# Patient Record
Sex: Female | Born: 1960 | Race: White | Hispanic: No | Smoking: Current every day smoker
Health system: Southern US, Community
[De-identification: ages and names within clinical notes are randomized; demographics above are authoritative.]

## PROBLEM LIST (undated history)

## (undated) DIAGNOSIS — F32A Depression, unspecified: Secondary | ICD-10-CM

## (undated) DIAGNOSIS — F1110 Opioid abuse, uncomplicated: Secondary | ICD-10-CM

## (undated) DIAGNOSIS — J45909 Unspecified asthma, uncomplicated: Secondary | ICD-10-CM

## (undated) DIAGNOSIS — F329 Major depressive disorder, single episode, unspecified: Secondary | ICD-10-CM

## (undated) HISTORY — PX: LEG SURGERY: SHX1003

---

## 2017-01-01 ENCOUNTER — Encounter (HOSPITAL_COMMUNITY): Payer: Self-pay | Admitting: Emergency Medicine

## 2017-01-01 ENCOUNTER — Emergency Department (HOSPITAL_COMMUNITY): Payer: BC Managed Care – PPO

## 2017-01-01 ENCOUNTER — Observation Stay (HOSPITAL_COMMUNITY)
Admission: EM | Admit: 2017-01-01 | Discharge: 2017-01-02 | Disposition: A | Discharge status: Home / Self Care | Payer: BC Managed Care – PPO | Attending: Internal Medicine | Admitting: Internal Medicine

## 2017-01-01 DIAGNOSIS — F32A Depression, unspecified: Secondary | ICD-10-CM | POA: Diagnosis present

## 2017-01-01 DIAGNOSIS — R0902 Hypoxemia: Secondary | ICD-10-CM

## 2017-01-01 DIAGNOSIS — E872 Acidosis: Secondary | ICD-10-CM | POA: Diagnosis not present

## 2017-01-01 DIAGNOSIS — J9621 Acute and chronic respiratory failure with hypoxia: Principal | ICD-10-CM | POA: Insufficient documentation

## 2017-01-01 DIAGNOSIS — F329 Major depressive disorder, single episode, unspecified: Secondary | ICD-10-CM | POA: Insufficient documentation

## 2017-01-01 DIAGNOSIS — K219 Gastro-esophageal reflux disease without esophagitis: Secondary | ICD-10-CM | POA: Insufficient documentation

## 2017-01-01 DIAGNOSIS — Z79899 Other long term (current) drug therapy: Secondary | ICD-10-CM | POA: Diagnosis not present

## 2017-01-01 DIAGNOSIS — F1110 Opioid abuse, uncomplicated: Secondary | ICD-10-CM | POA: Diagnosis not present

## 2017-01-01 DIAGNOSIS — Z720 Tobacco use: Secondary | ICD-10-CM | POA: Insufficient documentation

## 2017-01-01 DIAGNOSIS — J45901 Unspecified asthma with (acute) exacerbation: Secondary | ICD-10-CM | POA: Insufficient documentation

## 2017-01-01 DIAGNOSIS — D72829 Elevated white blood cell count, unspecified: Secondary | ICD-10-CM | POA: Insufficient documentation

## 2017-01-01 DIAGNOSIS — Z888 Allergy status to other drugs, medicaments and biological substances status: Secondary | ICD-10-CM | POA: Diagnosis not present

## 2017-01-01 DIAGNOSIS — Z880 Allergy status to penicillin: Secondary | ICD-10-CM | POA: Diagnosis not present

## 2017-01-01 DIAGNOSIS — A419 Sepsis, unspecified organism: Secondary | ICD-10-CM | POA: Diagnosis present

## 2017-01-01 DIAGNOSIS — Z8249 Family history of ischemic heart disease and other diseases of the circulatory system: Secondary | ICD-10-CM | POA: Insufficient documentation

## 2017-01-01 DIAGNOSIS — Z881 Allergy status to other antibiotic agents status: Secondary | ICD-10-CM | POA: Insufficient documentation

## 2017-01-01 DIAGNOSIS — I10 Essential (primary) hypertension: Secondary | ICD-10-CM | POA: Insufficient documentation

## 2017-01-01 HISTORY — DX: Opioid abuse, uncomplicated: F11.10

## 2017-01-01 HISTORY — DX: Depression, unspecified: F32.A

## 2017-01-01 HISTORY — DX: Unspecified asthma, uncomplicated: J45.909

## 2017-01-01 HISTORY — DX: Major depressive disorder, single episode, unspecified: F32.9

## 2017-01-01 LAB — COMPREHENSIVE METABOLIC PANEL
ALBUMIN: 3.8 g/dL (ref 3.5–5.0)
ALT: 11 U/L — ABNORMAL LOW (ref 14–54)
ANION GAP: 8 (ref 5–15)
AST: 15 U/L (ref 15–41)
Alkaline Phosphatase: 55 U/L (ref 38–126)
BILIRUBIN TOTAL: 1.2 mg/dL (ref 0.3–1.2)
BUN: 13 mg/dL (ref 6–20)
CHLORIDE: 100 mmol/L — AB (ref 101–111)
CO2: 29 mmol/L (ref 22–32)
Calcium: 9.1 mg/dL (ref 8.9–10.3)
Creatinine, Ser: 0.78 mg/dL (ref 0.44–1.00)
GFR calc Af Amer: >60 mL/min (ref >60)
GFR calc non Af Amer: >60 mL/min (ref >60)
GLUCOSE: 215 mg/dL — AB (ref 65–99)
POTASSIUM: 3.6 mmol/L (ref 3.5–5.1)
SODIUM: 137 mmol/L (ref 135–145)
Total Protein: 6.6 g/dL (ref 6.5–8.1)

## 2017-01-01 LAB — RAPID URINE DRUG SCREEN, HOSP PERFORMED
AMPHETAMINES: NOT DETECTED
BARBITURATES: NOT DETECTED
BENZODIAZEPINES: POSITIVE — AB
Cocaine: NOT DETECTED
Opiates: POSITIVE — AB
TETRAHYDROCANNABINOL: NOT DETECTED

## 2017-01-01 LAB — CBC WITH DIFFERENTIAL/PLATELET
BASOS ABS: 0 10*3/uL (ref 0.0–0.1)
BASOS PCT: 0 %
EOS ABS: 0.9 10*3/uL — AB (ref 0.0–0.7)
Eosinophils Relative: 7 %
HEMATOCRIT: 39 % (ref 36.0–46.0)
Hemoglobin: 12.1 g/dL (ref 12.0–15.0)
Lymphocytes Relative: 11 %
Lymphs Abs: 1.4 10*3/uL (ref 0.7–4.0)
MCH: 26.8 pg (ref 26.0–34.0)
MCHC: 31 g/dL (ref 30.0–36.0)
MCV: 86.3 fL (ref 78.0–100.0)
Monocytes Absolute: 0.2 10*3/uL (ref 0.1–1.0)
Monocytes Relative: 1 %
NEUTROS ABS: 10.7 10*3/uL — AB (ref 1.7–7.7)
NEUTROS PCT: 81 %
Platelets: 232 10*3/uL (ref 150–400)
RBC: 4.52 MIL/uL (ref 3.87–5.11)
RDW: 15.9 % — ABNORMAL HIGH (ref 11.5–15.5)
WBC: 13.3 10*3/uL — ABNORMAL HIGH (ref 4.0–10.5)

## 2017-01-01 LAB — ETHANOL

## 2017-01-01 MED ORDER — ALBUTEROL (5 MG/ML) CONTINUOUS INHALATION SOLN
10.0000 mg/h | INHALATION_SOLUTION | RESPIRATORY_TRACT | Status: AC
  Administered 2017-01-01: 10 mg/h via RESPIRATORY_TRACT
  Filled 2017-01-01: qty 20

## 2017-01-01 MED ORDER — ALBUTEROL SULFATE (2.5 MG/3ML) 0.083% IN NEBU
5.0000 mg | INHALATION_SOLUTION | Freq: Once | RESPIRATORY_TRACT | Status: AC
  Administered 2017-01-01: 5 mg via RESPIRATORY_TRACT

## 2017-01-01 MED ORDER — ALBUTEROL SULFATE (2.5 MG/3ML) 0.083% IN NEBU
INHALATION_SOLUTION | RESPIRATORY_TRACT | Status: AC
  Filled 2017-01-01: qty 6

## 2017-01-01 NOTE — ED Provider Notes (Signed)
[facility]-EMERGENCY DEPT Provider Note   CSN: [id number] Arrival date & time: 01/01/17  1848     History   Chief Complaint Chief Complaint  Patient presents with  . Shortness of Breath  . Medical Clearance    HPI Kristin Estes is a 56 y.o. female.    Patient is a 56 year old female with past medical history significant for asthma, tobacco use, heroin use, who presents to the emergency department with shortness of breath and hypoxia. Patient was going to an inpatient rehabilitation facility to check in for heroin use. When they arrived to this facility patient was having a difficulty time breathing and was very somnolent. O2 sats were 88% with EMS. Patient states that she had a one-day history of cough, congestion, increased sputum production. Endorses some shortness of breath that progressively worse throughout the day. Denies chest pain. Denies fever or chills. States that she snorted cocaine prior to getting to the rehabilitation facility. Denies any other drug use. Denies prior history of DVT/PE.   Past Medical History:  Diagnosis Date  . Asthma   . Depression   . Heroin abuse     Patient Active Problem List   Diagnosis Date Noted  . Asthma exacerbation 01/01/2017  . Acute on chronic respiratory failure with hypoxia (HCC) 01/01/2017  . GERD (gastroesophageal reflux disease) 01/01/2017  . Heroin abuse   . Depression     History reviewed. No pertinent surgical history.  OB History    No data available       Home Medications    Prior to Admission medications   Medication Sig Start Date End Date Taking? Authorizing Provider  albuterol (PROVENTIL) (2.5 MG/3ML) 0.083% nebulizer solution Take 2.5 mg by nebulization every 6 (six) hours as needed for wheezing.  11/10/16  Yes [provider]  ALPRAZolam Prudy Feeler) 1 MG tablet Take 1-2 tablets by mouth 3 (three) times daily as needed for anxiety.  12/22/16  Yes [provider]  amphetamine-dextroamphetamine  (ADDERALL) 30 MG tablet Take 30 mg by mouth 2 (two) times daily. 12/03/16  Yes [provider]  Brexpiprazole (REXULTI) 3 MG TABS Take 3 mg by mouth daily.   Yes [provider]  DULoxetine (CYMBALTA) 60 MG capsule Take 60 mg by mouth daily. 12/19/16  Yes [provider]  lisinopril (PRINIVIL,ZESTRIL) 20 MG tablet Take 20 mg by mouth daily. 10/14/16  Yes [provider]  PROAIR HFA 108 (90 Base) MCG/ACT inhaler Inhale 1-2 puffs into the lungs every 6 (six) hours as needed for wheezing.  11/16/16  Yes [provider]    Family History No family history on file.  Social History Social History  Substance Use Topics  . Smoking status: Not on file  . Smokeless tobacco: Not on file  . Alcohol use No     Allergies   Ceclor [cefaclor]; Ciprofloxacin; and Penicillins   Review of Systems Review of Systems  Constitutional: Positive for fatigue. Negative for appetite change and fever.  HENT: Positive for congestion.   Respiratory: Positive for cough and shortness of breath. Negative for chest tightness.   Cardiovascular: Negative for chest pain and leg swelling.  Gastrointestinal: Negative for abdominal pain, diarrhea and vomiting.  Genitourinary: Negative for dysuria.  Musculoskeletal: Negative for back pain.  Skin: Negative for rash.  Neurological: Negative for dizziness, seizures, weakness and light-headedness.  Psychiatric/Behavioral: Positive for agitation.     Physical Exam Updated Vital Signs BP (!) 136/99   Pulse (!) 123   Temp 98 F (36.7  C) (Oral)   Resp (!) 25   Ht 5\' 3"  (1.6 m)   Wt 78.9 kg (174 lb)   SpO2 97%   BMI 30.82 kg/m   Physical Exam  Constitutional: She is oriented to person, place, and time. She appears well-developed and well-nourished.  HENT:  Head: Atraumatic.  Mouth/Throat: Oropharynx is clear and moist.  Eyes: Conjunctivae and EOM are normal. Pupils are equal, round, and reactive to light.  Neck: Normal  range of motion. Neck supple. No JVD present.  Cardiovascular: Regular rhythm, normal heart sounds and intact distal pulses.   Tachycardia  Pulmonary/Chest: She is in respiratory distress. She has wheezes.  Mild respiratory distress with tachypnea. Diffuse inspiratory and expiratory wheezing throughout. No focal crackles or rales. Good air movement throughout. 2 L nasal cannula.  Abdominal: She exhibits no distension and no mass. There is no tenderness. There is no guarding.  Musculoskeletal: Normal range of motion. She exhibits no edema.  No unilateral leg swelling.  Neurological: She is alert and oriented to person, place, and time.  Skin: Skin is warm. Capillary refill takes less than 2 seconds. No pallor.  Psychiatric:  Anxious appearing     ED Treatments / Results  Labs (all labs ordered are listed, but only abnormal results are displayed) Labs Reviewed  CBC WITH DIFFERENTIAL/PLATELET - Abnormal; Notable for the following:       Result Value   WBC 13.3 (*)    RDW 15.9 (*)    Neutro Abs 10.7 (*)    Eosinophils Absolute 0.9 (*)    All other components within normal limits  COMPREHENSIVE METABOLIC PANEL - Abnormal; Notable for the following:    Chloride 100 (*)    Glucose, Bld 215 (*)    ALT 11 (*)    All other components within normal limits  RAPID URINE DRUG SCREEN, HOSP PERFORMED - Abnormal; Notable for the following:    Opiates POSITIVE (*)    Benzodiazepines POSITIVE (*)    All other components within normal limits  ETHANOL  HCG, QUANTITATIVE, PREGNANCY  BLOOD GAS, ARTERIAL    EKG  EKG Interpretation  Date/Time:  Thursday January 01 2017 18:52:34 EDT Ventricular Rate:  91 PR Interval:  138 QRS Duration: 78 QT Interval:  380 QTC Calculation: 467 R Axis:   51 Text Interpretation:  Normal sinus rhythm Normal ECG No old tracing to compare Confirmed by Dione Booze ([id number]) on 01/01/2017 10:52:13 PM       Radiology Dg Chest 2 View  Result Date:  01/01/2017 CLINICAL DATA:  Wheezing and coughing.  Recent use of heroin. EXAM: CHEST  2 VIEW COMPARISON:  None. FINDINGS: The lungs are clear. The pulmonary vasculature is normal. Heart size is normal. Hilar and mediastinal contours are unremarkable. There is no pleural effusion. IMPRESSION: No active cardiopulmonary disease. Electronically Signed   By: Ellery Plunk M.D.   On: 01/01/2017 21:51    Procedures Procedures (including critical care time)  Medications Ordered in ED Medications  albuterol (PROVENTIL,VENTOLIN) solution continuous neb (0 mg/hr Nebulization Stopped 01/01/17 2229)  albuterol (PROVENTIL) (2.5 MG/3ML) 0.083% nebulizer solution 5 mg (5 mg Nebulization Given 01/01/17 1901)     Initial Impression / Assessment and Plan / ED Course  I have reviewed the triage vital signs and the nursing notes.  Pertinent labs & imaging results that were available during my care of the patient were reviewed by me and considered in my medical decision making (see chart for details).     Patient  is a 56 year old with past medical history significant for asthma and heroin abuse, who presents to the emergency department with a one-day history of shortness of breath. SpO2 88% with EMS. Given IV Solu-Medrol and DuoNeb's prior to arrival. Patient endorsed snorting heroin approximately 3 PM. On arrival patient with mild respiratory distress. Able to speak in full sentences, no respiratory depression. Lungs with diffuse inspiratory and expiratory wheezing. DVT.  Lab work remarkable for leukocytosis of 13.3. Hemoglobin stable. No significant electrolyte abnormalities. UDS positive for opioids and benzos (takes Xanax). Chest x-ray showed no signs of pneumonia or pneumothorax. Patient most likely with an asthma exacerbation secondary to upper respiratory infection. Patient given continuous albuterol nebulizer.  On reevaluation patient with improvement of her respiratory distress, speaking in full  sentences. Continues to have diffuse wheezing. Attempted to wean off of O2, hypoxic at 87% on room air. Placed on 2 L nasal cannula. Pt is not somnolent or with respiratory depression, narcan not necessary at this time.  Doubt PE, low risk well's criteria, more likely asthma in the setting of wheezing and URI.    Patient admitted to hospitalist, Dr. Clyde LundborgNiu, for further management of acute hypoxic respiratory failure.  Final Clinical Impressions(s) / ED Diagnoses   Final diagnoses:  Heroin abuse  Hypoxia  Moderate asthma with exacerbation, unspecified whether persistent    New Prescriptions New Prescriptions   No medications on file     Corena HerterMumma, Kendle Turbin, MD 01/02/17 [id number]    Benjiman CorePickering, Nathan, MD 01/04/17 [phone]

## 2017-01-01 NOTE — ED Notes (Signed)
[facility] that [facility]. can return to their facility when pt. Is discharged home .

## 2017-01-01 NOTE — ED Triage Notes (Signed)
Per ems, pt was getting checked out at [facility] for [facility] use, pt used last at 3pm, o2 sats 89% RA for ems, pt had wheezing and cough throughout, pt takes albuterol at home, gave her one neb, 125 solumedrol. Pt is AAOX4.

## 2017-01-01 NOTE — ED Triage Notes (Signed)
Pt states she just wants to be cleared to go back to [facility].

## 2017-01-01 NOTE — H&P (Signed)
History and Physical    [name] ZOX:[id number] DOB: [birth year] DOA: 01/01/2017  Referring MD/NP/PA:   PCP: System, Pcp Not In   Patient coming from:  The patient is coming from home.  At baseline, pt is independent for most of ADL.   Chief Complaint: Cough, shortness breath, wheezing  HPI: Kristin Estes is a 56 y.o. female with medical history significant of hypertension, asthma, GERD, depression, hearing abuse, tobacco abuse, who presents with cough, shortness breath and wheezing.  Patient states that she has been having cough, shortness breath, and wheezing for 3 days, which has been progressively getting worse. She coughs up yellow colored sputum. Denies fever, chills or chest pain. She can speak in full sentence. Denies nausea, vomiting, diarrhea, abdominal pain, symptoms of UTI or unilateral weakness. Patient states that she went to [facility] yesterday and physical examination. Ptient was found to have wheezing and IN desaturation, therefore she was advised to come to the hospital for further eval and treatment.  ED Course: pt was found to have WBC 13.3, lactic acid >7.0, positive UDS for opiate and benzo, electrolytes renal function okay, tachycardia, tachypnea, oxygen saturation 89% on room air, temperature normal, negative chest x-ray for infiltration, alcohol level less than 5. Patient is placed on telemetry bed for observation.  Review of Systems:   General: no fevers, chills, no changes in body weight, has poor appetite, has fatigue.  HEENT: no blurry vision, hearing changes or sore throat Respiratory: has dyspnea, coughing, wheezing CV: no chest pain, no palpitations GI: no nausea, vomiting, abdominal pain, diarrhea, constipation GU: no dysuria, burning on urination, increased urinary frequency, hematuria  Ext: no leg edema Neuro: no unilateral weakness, numbness, or tingling, no vision change or hearing loss Skin: no rash, no skin tear. MSK: No muscle spasm, no  deformity, no limitation of range of movement in spin Heme: No easy bruising.  Travel history: No recent long distant travel.  Allergy:  Allergies  Allergen Reactions  . Ceclor [Cefaclor] Anaphylaxis  . Ciprofloxacin Anaphylaxis  . Penicillins Anaphylaxis    Past Medical History:  Diagnosis Date  . Asthma   . Depression   . Heroin abuse     Past Surgical History:  Procedure Laterality Date  . LEG SURGERY Left    Hematoma    Social History:  reports that she has been smoking.  She does not have any smokeless tobacco history on file. She reports that she uses drugs. She reports that she does not drink alcohol.  Family History:  Family History  Problem Relation Age of Onset  . Hypertension Mother      Prior to Admission medications   Medication Sig Start Date End Date Taking? Authorizing Provider  albuterol (PROVENTIL) (2.5 MG/3ML) 0.083% nebulizer solution Take 2.5 mg by nebulization every 6 (six) hours as needed for wheezing.  11/10/16  Yes [provider]  ALPRAZolam Prudy Feeler) 1 MG tablet Take 1-2 tablets by mouth 3 (three) times daily as needed for anxiety.  12/22/16  Yes [provider]  amphetamine-dextroamphetamine (ADDERALL) 30 MG tablet Take 30 mg by mouth 2 (two) times daily. 12/03/16  Yes [provider]  Brexpiprazole (REXULTI) 3 MG TABS Take 3 mg by mouth daily.   Yes [provider]  DULoxetine (CYMBALTA) 60 MG capsule Take 60 mg by mouth daily. 12/19/16  Yes [provider]  lisinopril (PRINIVIL,ZESTRIL) 20 MG tablet Take 20 mg by mouth daily. 10/14/16  Yes [provider]  PROAIR HFA 108 [phone] Base)  MCG/ACT inhaler Inhale 1-2 puffs into the lungs every 6 (six) hours as needed for wheezing.  11/16/16  Yes [provider]    Physical Exam: Vitals:   01/02/17 0130 01/02/17 0304 01/02/17 0424 01/02/17 0501  BP: (!) 141/86 (!) 147/75  (!) 145/67  Pulse: (!) 113 (!) 110 (!) 119 (!) 116  Resp: 14 18 20 19     Temp:  98 F (36.7 C)  98.1 F (36.7 C)  TempSrc:  Oral  Oral  SpO2: 99% 99% 95% 100%  Weight:      Height:       General: Not in acute distress. Patient is tremulous and anxious. HEENT:       Eyes: PERRL, EOMI, no scleral icterus.       ENT: No discharge from the ears and nose, no pharynx injection, no tonsillar enlargement.        Neck: No JVD, no bruit, no mass felt. Heme: No neck lymph node enlargement. Cardiac: S1/S2, RRR, No murmurs, No gallops or rubs. Respiratory: Patient has wheezing bilaterally.  GI: Soft, nondistended, nontender, no rebound pain, no organomegaly, BS present. GU: No hematuria Ext: No pitting leg edema bilaterally. 2+DP/PT pulse bilaterally. Musculoskeletal: No joint deformities, No joint redness or warmth, no limitation of ROM in spin. Skin: No rashes.  Neuro: Alert, oriented X3, cranial nerves II-XII grossly intact, moves all extremities normally. Psych: Patient is not psychotic, no suicidal or hemocidal ideation.  Labs on Admission: I have personally reviewed following labs and imaging studies  CBC:  Recent Labs Lab 01/01/17 2110  WBC 13.3*  NEUTROABS 10.7*  HGB 12.1  HCT 39.0  MCV 86.3  PLT 232   Basic Metabolic Panel:  Recent Labs Lab 01/01/17 2110  NA 137  K 3.6  CL 100*  CO2 29  GLUCOSE 215*  BUN 13  CREATININE 0.78  CALCIUM 9.1   GFR: Estimated Creatinine Clearance: 80 mL/min (by C-G formula based on SCr of 0.78 mg/dL). Liver Function Tests:  Recent Labs Lab 01/01/17 2110  AST 15  ALT 11*  ALKPHOS 55  BILITOT 1.2  PROT 6.6  ALBUMIN 3.8   No results for input(s): LIPASE, AMYLASE in the last 168 hours. No results for input(s): AMMONIA in the last 168 hours. Coagulation Profile: No results for input(s): INR, PROTIME in the last 168 hours. Cardiac Enzymes: No results for input(s): CKTOTAL, CKMB, CKMBINDEX, TROPONINI in the last 168 hours. BNP (last 3 results) No results for input(s): PROBNP in the last 8760  hours. HbA1C: No results for input(s): HGBA1C in the last 72 hours. CBG: No results for input(s): GLUCAP in the last 168 hours. Lipid Profile: No results for input(s): CHOL, HDL, LDLCALC, TRIG, CHOLHDL, LDLDIRECT in the last 72 hours. Thyroid Function Tests: No results for input(s): TSH, T4TOTAL, FREET4, T3FREE, THYROIDAB in the last 72 hours. Anemia Panel: No results for input(s): VITAMINB12, FOLATE, FERRITIN, TIBC, IRON, RETICCTPCT in the last 72 hours. Urine analysis: No results found for: COLORURINE, APPEARANCEUR, LABSPEC, PHURINE, GLUCOSEU, HGBUR, BILIRUBINUR, KETONESUR, PROTEINUR, UROBILINOGEN, NITRITE, LEUKOCYTESUR Sepsis Labs: @LABRCNTIP (procalcitonin:4,lacticidven:4) )No results found for this or any previous visit (from the past 240 hour(s)).   Radiological Exams on Admission: Dg Chest 2 View  Result Date: 01/01/2017 CLINICAL DATA:  Wheezing and coughing.  Recent use of heroin. EXAM: CHEST  2 VIEW COMPARISON:  None. FINDINGS: The lungs are clear. The pulmonary vasculature is normal. Heart size is normal. Hilar and mediastinal contours are unremarkable. There is no pleural effusion. IMPRESSION: No  active cardiopulmonary disease. Electronically Signed   By: Ellery Plunk M.D.   On: 01/01/2017 21:51     EKG: Independently reviewed.  Sinus rhythm, tachycardia, no ischemic change.  Assessment/Plan Principal Problem:   Acute on chronic respiratory failure with hypoxia (HCC) Active Problems:   Heroin abuse   Depression   Asthma exacerbation   Tobacco abuse   Sepsis (HCC)  Acute on chronic respiratory failure with hypoxia due to asthma exacerbation and sepsis: Patient has bilateral wheezing on AUSCULTATION, productive cough, and shortness of breath, consistent with asthma exacerbation. No infiltration on chest x-ray. Patient does not have chest pain, less likely to have PE. Patient meets criteria for sepsis with leukocytosis, and tachypnea, elevated lactate>7.0. Her  significantly elevated lactic acid is likely partially from tremulous muscle movement. Patient is hemodynamically stable.  -will place on tele bed for obs -Nebulizers: Scheduled Atrovent and prn Xopenex nebs -Z-pak -Solu-Medrol 60 mg IV q8h  -Mucinex for cough  -Urine S. pneumococcal antigen -Follow up blood culture x2, sputum culture, respiratory virus panel -will get Procalcitonin and trend lactic acid levels per sepsis protocol. -IVF: 3.5L of NS bolus in ED, followed by 125 cc/h   Tobacco abuse: -Did counseling about importance of quitting smoking -Nicotine patch  Depression:  no suicidal or homicidal ideations. -Continue home medications: RExulti, Cymbalta  HTN: -Lisinopril  Heroin abuse: -When necessary Ativan, clonidine, naproxen, buprenorphine -pt wants to go back to [facility]-->consult to SW   DVT ppx: SQ Lovenox Code Status: Full code Family Communication: None at bed side.   Disposition Plan:  Anticipate discharge back to previous home environment Consults called:  none Admission status: Obs / tele   Date of Service 01/02/2017    Lorretta Harp [facility] Pager [phone]  If 7PM-7AM, please contact night-coverage [email or web address] Password [facility] 01/02/2017, 6:42 AM

## 2017-01-01 NOTE — ED Notes (Signed)
Patient transported to X-ray 

## 2017-01-01 NOTE — ED Notes (Signed)
Nurse updated family on pt.'s admission plan .

## 2017-01-01 NOTE — ED Notes (Signed)
Kristin RilyPat Estes ( Sister)  : Can be called for updates regarding pt. Condition . Tel : [phone]

## 2017-01-02 ENCOUNTER — Encounter (HOSPITAL_COMMUNITY): Payer: Self-pay

## 2017-01-02 DIAGNOSIS — R0902 Hypoxemia: Secondary | ICD-10-CM

## 2017-01-02 DIAGNOSIS — Z720 Tobacco use: Secondary | ICD-10-CM

## 2017-01-02 DIAGNOSIS — J9621 Acute and chronic respiratory failure with hypoxia: Secondary | ICD-10-CM | POA: Diagnosis not present

## 2017-01-02 DIAGNOSIS — A419 Sepsis, unspecified organism: Secondary | ICD-10-CM | POA: Diagnosis present

## 2017-01-02 DIAGNOSIS — F329 Major depressive disorder, single episode, unspecified: Secondary | ICD-10-CM | POA: Diagnosis not present

## 2017-01-02 DIAGNOSIS — J45901 Unspecified asthma with (acute) exacerbation: Secondary | ICD-10-CM | POA: Diagnosis not present

## 2017-01-02 LAB — CBC
HEMATOCRIT: 37 % (ref 36.0–46.0)
HEMOGLOBIN: 11.9 g/dL — AB (ref 12.0–15.0)
MCH: 27.5 pg (ref 26.0–34.0)
MCHC: 32.2 g/dL (ref 30.0–36.0)
MCV: 85.5 fL (ref 78.0–100.0)
Platelets: 210 10*3/uL (ref 150–400)
RBC: 4.33 MIL/uL (ref 3.87–5.11)
RDW: 16.1 % — ABNORMAL HIGH (ref 11.5–15.5)
WBC: 12.3 10*3/uL — AB (ref 4.0–10.5)

## 2017-01-02 LAB — RESPIRATORY PANEL BY PCR
Adenovirus: NOT DETECTED
BORDETELLA PERTUSSIS-RVPCR: NOT DETECTED
CORONAVIRUS 229E-RVPPCR: NOT DETECTED
CORONAVIRUS HKU1-RVPPCR: NOT DETECTED
Chlamydophila pneumoniae: NOT DETECTED
Coronavirus NL63: NOT DETECTED
Coronavirus OC43: NOT DETECTED
Influenza A: NOT DETECTED
Influenza B: NOT DETECTED
METAPNEUMOVIRUS-RVPPCR: NOT DETECTED
MYCOPLASMA PNEUMONIAE-RVPPCR: NOT DETECTED
PARAINFLUENZA VIRUS 2-RVPPCR: NOT DETECTED
Parainfluenza Virus 1: NOT DETECTED
Parainfluenza Virus 3: NOT DETECTED
Parainfluenza Virus 4: NOT DETECTED
Respiratory Syncytial Virus: NOT DETECTED
Rhinovirus / Enterovirus: NOT DETECTED

## 2017-01-02 LAB — HIV ANTIBODY (ROUTINE TESTING W REFLEX): HIV SCREEN 4TH GENERATION: NONREACTIVE

## 2017-01-02 LAB — HCG, QUANTITATIVE, PREGNANCY: hCG, Beta Chain, Quant, S: <1 m[IU]/mL (ref <5)

## 2017-01-02 LAB — LACTIC ACID, PLASMA
LACTIC ACID, VENOUS: 2.4 mmol/L — AB (ref 0.5–1.9)
Lactic Acid, Venous: 7 mmol/L (ref 0.5–1.9)

## 2017-01-02 LAB — STREP PNEUMONIAE URINARY ANTIGEN: STREP PNEUMO URINARY ANTIGEN: NEGATIVE

## 2017-01-02 LAB — PROCALCITONIN

## 2017-01-02 MED ORDER — AZITHROMYCIN 500 MG PO TABS: 250.0000 mg | ORAL_TABLET | Freq: Every day | ORAL | Status: DC

## 2017-01-02 MED ORDER — LEVALBUTEROL HCL 1.25 MG/0.5ML IN NEBU
1.2500 mg | INHALATION_SOLUTION | Freq: Three times a day (TID) | RESPIRATORY_TRACT | Status: DC
Start: 2017-01-02 — End: 2017-01-02
  Administered 2017-01-02 (×2): 1.25 mg via RESPIRATORY_TRACT
  Filled 2017-01-02 (×2): qty 0.5

## 2017-01-02 MED ORDER — LEVALBUTEROL HCL 0.63 MG/3ML IN NEBU: 0.6300 mg | INHALATION_SOLUTION | Freq: Three times a day (TID) | RESPIRATORY_TRACT | Status: DC | PRN

## 2017-01-02 MED ORDER — ONDANSETRON HCL 4 MG/2ML IJ SOLN: 4.0000 mg | Freq: Three times a day (TID) | INTRAMUSCULAR | Status: DC | PRN

## 2017-01-02 MED ORDER — NICOTINE 21 MG/24HR TD PT24: 21.0000 mg | MEDICATED_PATCH | Freq: Every day | TRANSDERMAL | 0 refills | Status: AC

## 2017-01-02 MED ORDER — AZITHROMYCIN 250 MG PO TABS: 250.0000 mg | ORAL_TABLET | Freq: Every day | ORAL | 0 refills | Status: AC

## 2017-01-02 MED ORDER — BUPRENORPHINE HCL 2 MG SL SUBL
4.0000 mg | SUBLINGUAL_TABLET | Freq: Every day | SUBLINGUAL | Status: DC
  Administered 2017-01-02: 4 mg via SUBLINGUAL
  Filled 2017-01-02: qty 2

## 2017-01-02 MED ORDER — ENOXAPARIN SODIUM 40 MG/0.4ML ~~LOC~~ SOLN
40.0000 mg | SUBCUTANEOUS | Status: DC
  Filled 2017-01-02: qty 0.4

## 2017-01-02 MED ORDER — PREDNISONE 10 MG PO TABS: 10.0000 mg | ORAL_TABLET | Freq: Every day | ORAL | 0 refills | Status: AC

## 2017-01-02 MED ORDER — SODIUM CHLORIDE 0.9 % IV BOLUS (SEPSIS)
1000.0000 mL | Freq: Once | INTRAVENOUS | Status: AC
  Administered 2017-01-02: 1000 mL via INTRAVENOUS

## 2017-01-02 MED ORDER — CLONIDINE HCL 0.1 MG PO TABS: 0.1000 mg | ORAL_TABLET | Freq: Three times a day (TID) | ORAL | Status: DC

## 2017-01-02 MED ORDER — AMPHETAMINE-DEXTROAMPHETAMINE 10 MG PO TABS
30.0000 mg | ORAL_TABLET | Freq: Two times a day (BID) | ORAL | Status: DC
  Filled 2017-01-02: qty 3

## 2017-01-02 MED ORDER — IPRATROPIUM BROMIDE 0.02 % IN SOLN
0.5000 mg | Freq: Three times a day (TID) | RESPIRATORY_TRACT | Status: DC
  Administered 2017-01-02 (×2): 0.5 mg via RESPIRATORY_TRACT
  Filled 2017-01-02 (×2): qty 2.5

## 2017-01-02 MED ORDER — AZITHROMYCIN 500 MG PO TABS
500.0000 mg | ORAL_TABLET | Freq: Every day | ORAL | Status: AC
  Administered 2017-01-02: 500 mg via ORAL
  Filled 2017-01-02: qty 1

## 2017-01-02 MED ORDER — ACETAMINOPHEN 325 MG PO TABS: 650.0000 mg | ORAL_TABLET | Freq: Four times a day (QID) | ORAL | Status: DC | PRN

## 2017-01-02 MED ORDER — ALPRAZOLAM 0.25 MG PO TABS: 1.0000 mg | ORAL_TABLET | Freq: Three times a day (TID) | ORAL | Status: DC | PRN

## 2017-01-02 MED ORDER — LEVALBUTEROL HCL 1.25 MG/0.5ML IN NEBU
1.2500 mg | INHALATION_SOLUTION | Freq: Four times a day (QID) | RESPIRATORY_TRACT | Status: DC
  Administered 2017-01-02: 1.25 mg via RESPIRATORY_TRACT
  Filled 2017-01-02: qty 0.5

## 2017-01-02 MED ORDER — ZOLPIDEM TARTRATE 5 MG PO TABS: 5.0000 mg | ORAL_TABLET | Freq: Every evening | ORAL | Status: DC | PRN

## 2017-01-02 MED ORDER — IPRATROPIUM BROMIDE 0.02 % IN SOLN
0.5000 mg | RESPIRATORY_TRACT | Status: DC
  Administered 2017-01-02 (×2): 0.5 mg via RESPIRATORY_TRACT
  Filled 2017-01-02 (×2): qty 2.5

## 2017-01-02 MED ORDER — NAPROXEN 250 MG PO TABS
375.0000 mg | ORAL_TABLET | Freq: Two times a day (BID) | ORAL | Status: DC
Start: 2017-01-02 — End: 2017-01-02
  Administered 2017-01-02 (×2): 375 mg via ORAL
  Filled 2017-01-02 (×2): qty 2

## 2017-01-02 MED ORDER — METHYLPREDNISOLONE SODIUM SUCC 125 MG IJ SOLR
60.0000 mg | Freq: Three times a day (TID) | INTRAMUSCULAR | Status: DC
  Administered 2017-01-02 (×2): 60 mg via INTRAVENOUS
  Filled 2017-01-02 (×2): qty 2

## 2017-01-02 MED ORDER — SODIUM CHLORIDE 0.9 % IV SOLN
INTRAVENOUS | Status: DC
  Administered 2017-01-02: 01:00:00 via INTRAVENOUS

## 2017-01-02 MED ORDER — LISINOPRIL 20 MG PO TABS
20.0000 mg | ORAL_TABLET | Freq: Every day | ORAL | Status: DC
Start: 2017-01-02 — End: 2017-01-02
  Administered 2017-01-02: 20 mg via ORAL
  Filled 2017-01-02: qty 1

## 2017-01-02 MED ORDER — HYDRALAZINE HCL 20 MG/ML IJ SOLN: 5.0000 mg | INTRAMUSCULAR | Status: DC | PRN

## 2017-01-02 MED ORDER — NICOTINE 21 MG/24HR TD PT24: 21.0000 mg | MEDICATED_PATCH | Freq: Every day | TRANSDERMAL | Status: DC

## 2017-01-02 MED ORDER — BREXPIPRAZOLE 1 MG PO TABS
3.0000 mg | ORAL_TABLET | Freq: Every day | ORAL | Status: DC
  Administered 2017-01-02: 3 mg via ORAL
  Filled 2017-01-02: qty 3

## 2017-01-02 MED ORDER — CLONIDINE HCL 0.1 MG PO TABS
0.1000 mg | ORAL_TABLET | Freq: Three times a day (TID) | ORAL | Status: DC
  Administered 2017-01-02 (×3): 0.1 mg via ORAL
  Filled 2017-01-02 (×3): qty 1

## 2017-01-02 MED ORDER — SODIUM CHLORIDE 0.9 % IV BOLUS (SEPSIS)
2500.0000 mL | Freq: Once | INTRAVENOUS | Status: AC
  Administered 2017-01-02: 2500 mL via INTRAVENOUS

## 2017-01-02 MED ORDER — ALPRAZOLAM 0.5 MG PO TABS
1.0000 mg | ORAL_TABLET | Freq: Three times a day (TID) | ORAL | Status: DC | PRN
  Administered 2017-01-02 (×2): 1 mg via ORAL
  Filled 2017-01-02 (×2): qty 2

## 2017-01-02 MED ORDER — DULOXETINE HCL 60 MG PO CPEP
60.0000 mg | ORAL_CAPSULE | Freq: Every day | ORAL | Status: DC
  Administered 2017-01-02: 60 mg via ORAL
  Filled 2017-01-02: qty 1

## 2017-01-02 NOTE — Progress Notes (Signed)
Pt still tremoring paged MD again. No response from first page. Gave pt Xanax.

## 2017-01-02 NOTE — Progress Notes (Signed)
Patient discharge teaching given, including activity, diet, follow-up appoints, and medications. Patient verbalized understanding of all discharge instructions. IV access was d/c'd. Vitals are stable. Skin is intact except as charted in most recent assessments. Pt to be escorted out by NT, to be driven to rehab facility.

## 2017-01-02 NOTE — Discharge Summary (Signed)
Physician Discharge Summary  Kristin Estes ZOX:[id number] DOB: Sep 12, 1960 DOA: 01/01/2017  PCP: System, Pcp Not In  Admit date: 01/01/2017 Discharge date: 01/02/2017  Time spent: 45 minutes  Recommendations for Outpatient Follow-up:  Patient will be discharged to [facility].  Patient will need to follow up with primary care provider within one week of discharge, repeat CBC.  Patient should continue medications as prescribed.  Patient should follow a regular diet.   Discharge Diagnoses:  Acute on chronic hypoxic respiratory failure/asthma exacerbation Lactic acidosis Leukocytosis Depression Essential hypertension Tobacco abuse Heroin Abuse  Discharge Condition: Stable  Diet recommendation: regular  Filed Weights   01/01/17 1856  Weight: 78.9 kg (174 lb)    History of present illness:  On 01/01/2017 by Dr. Lorretta Harp Linnette Panella is a 56 y.o. female with medical history significant of hypertension, asthma, GERD, depression, hearing abuse, tobacco abuse, who presents with cough, shortness breath and wheezing.  Patient states that she has been having cough, shortness breath, and wheezing for 3 days, which has been progressively getting worse. She coughs up yellow colored sputum. Denies fever, chills or chest pain. She can speak in full sentence. Denies nausea, vomiting, diarrhea, abdominal pain, symptoms of UTI or unilateral weakness. Patient states that she went to [facility] yesterday and physical examination. Ptient was found to have wheezing and IN desaturation, therefore she was advised to come to the hospital for further eval and treatment.  Hospital Course:  Acute on chronic hypoxic respiratory failure/asthma exacerbation -Patient presented with shortness of breath, cough, wheezing -Chest x-ray unremarkable for infection -Patient was placed on treatments along with Z-Pak, Solu-Medrol, nebs -Currently has improved and no longer complaining of shortness of  breath -Currently maintaining oxygen saturations in the high 90s on room air -Will discharge patient with prednisone taper, azithromycin -Strep pneumonia urine antigen negative  Lactic acidosis -thought to have sepsis however no infection found -As above, chest x-ray unremarkable for infection, patient not complaining of any urinary symptoms -Lactic acid was 7 upon admission, has now trended down to 2.4 -Suspect lactic acidosis secondary to patient's asthma exacerbation and respiratory status  Leukocytosis -Suspect reactive versus secondary to viral cause given patient's asthma exacerbation -Patient was also placed on Solu-Medrol -Leukocytosis trending downward -Will repeat CBC in 1 week  Depression -Continue home medications Cymbalta, Rexulti  Essential hypertension -Currently stable -Continue lisinopril  Tobacco abuse -Smoking cessation discussed, continue nicotine patch  Heroin Abuse -Patient wishes to go to [facility] -Social worker consultation appreciated -During hospitalization, patient was placed on Ativan, clonidine, naproxen, buprenorphine    Procedures: None  Consultations: None  Discharge Exam: Vitals:   01/02/17 0501 01/02/17 1613  BP: (!) 145/67 (!) 152/69  Pulse: (!) 116 (!) 108  Resp: 19 20  Temp: 98.1 F (36.7 C) 98.3 F (36.8 C)   Patient states breathing has improved. Denies cough or further shortness of breath. Denies chest pain, abdominal pain, nausea, vomiting, diarrhea, constipation. Patients about receiving suboxone.    General: Well developed, well nourished, NAD, appears stated age  HEENT: NCAT, PERRLA, EOMI, Anicteic Sclera, mucous membranes moist.  Neck: Supple, no JVD, no masses  Cardiovascular: S1 S2 auscultated, no rubs, murmurs or gallops. tachycardic  Respiratory: Diminished but clear, no wheezing noted  Abdomen: Soft, nontender, nondistended, + bowel sounds  Extremities: warm dry without cyanosis clubbing or  edema  Neuro: AAOx3, cranial nerves grossly intact. Strength 5/5 in patient's upper and lower extremities bilaterally  Skin: Without rashes exudates or nodules  Psych:  Flat, however, appropriate  Discharge Instructions Discharge Instructions    Discharge instructions    Complete by:  As directed    [facility]  Patient will need to follow up with primary care provider within one week of discharge, repeat CBC.  Patient should continue medications as prescribed.  Patient should follow a regular diet.     Current Discharge Medication List    START taking these medications   Details  azithromycin (ZITHROMAX) 250 MG tablet Take 1 tablet (250 mg total) by mouth daily. Qty: 4 tablet, Refills: 0    nicotine (NICODERM CQ - DOSED IN MG/24 HOURS) 21 mg/24hr patch Place 1 patch (21 mg total) onto the skin daily. Qty: 28 patch, Refills: 0    predniSONE (DELTASONE) 10 MG tablet Take 1 tablet (10 mg total) by mouth daily. Day 1 take 5 tablets. Day 2 take 4 tablets. Day 3 take 3 tablets. Day 4 take 2 tablets. Day 5 take 1 tablet. Qty: 15 tablet, Refills: 0      CONTINUE these medications which have NOT CHANGED   Details  albuterol (PROVENTIL) (2.5 MG/3ML) 0.083% nebulizer solution Take 2.5 mg by nebulization every 6 (six) hours as needed for wheezing.  Refills: 6    ALPRAZolam (XANAX) 1 MG tablet Take 1-2 tablets by mouth 3 (three) times daily as needed for anxiety.  Refills: 2    amphetamine-dextroamphetamine (ADDERALL) 30 MG tablet Take 30 mg by mouth 2 (two) times daily. Refills: 0    Brexpiprazole (REXULTI) 3 MG TABS Take 3 mg by mouth daily.    DULoxetine (CYMBALTA) 60 MG capsule Take 60 mg by mouth daily. Refills: 5    lisinopril (PRINIVIL,ZESTRIL) 20 MG tablet Take 20 mg by mouth daily. Refills: 0    PROAIR HFA 108 (90 Base) MCG/ACT inhaler Inhale 1-2 puffs into the lungs every 6 (six) hours as needed for wheezing.  Refills: 6        Allergies  Allergen Reactions  . Ceclor [Cefaclor] Anaphylaxis  . Ciprofloxacin Anaphylaxis  . Penicillins Anaphylaxis   Follow-up Information    Primary care physician. Schedule an appointment as soon as possible for a visit in 1 week(s).   Why:  Hospital follow up           The results of significant diagnostics from this hospitalization (including imaging, microbiology, ancillary and laboratory) are listed below for reference.    Significant Diagnostic Studies: Dg Chest 2 View  Result Date: 01/01/2017 CLINICAL DATA:  Wheezing and coughing.  Recent use of heroin. EXAM: CHEST  2 VIEW COMPARISON:  None. FINDINGS: The lungs are clear. The pulmonary vasculature is normal. Heart size is normal. Hilar and mediastinal contours are unremarkable. There is no pleural effusion. IMPRESSION: No active cardiopulmonary disease. Electronically Signed   By: Ellery Plunkaniel R Mitchell M.D.   On: 01/01/2017 21:51    Microbiology: Recent Results (from the past 240 hour(s))  Respiratory Panel by PCR     Status: None   Collection Time: 01/02/17 12:59 AM  Result Value Ref Range Status   Adenovirus NOT DETECTED NOT DETECTED Final   Coronavirus 229E NOT DETECTED NOT DETECTED Final   Coronavirus HKU1 NOT DETECTED NOT DETECTED Final   Coronavirus NL63 NOT DETECTED NOT DETECTED Final   Coronavirus OC43 NOT DETECTED NOT DETECTED Final   Metapneumovirus NOT DETECTED NOT DETECTED Final   Rhinovirus / Enterovirus NOT DETECTED NOT DETECTED Final   Influenza A NOT DETECTED NOT DETECTED Final  Influenza B NOT DETECTED NOT DETECTED Final   Parainfluenza Virus 1 NOT DETECTED NOT DETECTED Final   Parainfluenza Virus 2 NOT DETECTED NOT DETECTED Final   Parainfluenza Virus 3 NOT DETECTED NOT DETECTED Final   Parainfluenza Virus 4 NOT DETECTED NOT DETECTED Final   Respiratory Syncytial Virus NOT DETECTED NOT DETECTED Final   Bordetella pertussis NOT DETECTED NOT DETECTED Final   Chlamydophila pneumoniae NOT  DETECTED NOT DETECTED Final   Mycoplasma pneumoniae NOT DETECTED NOT DETECTED Final     Labs: Basic Metabolic Panel:  Recent Labs Lab 01/01/17 2110  NA 137  K 3.6  CL 100*  CO2 29  GLUCOSE 215*  BUN 13  CREATININE 0.78  CALCIUM 9.1   Liver Function Tests:  Recent Labs Lab 01/01/17 2110  AST 15  ALT 11*  ALKPHOS 55  BILITOT 1.2  PROT 6.6  ALBUMIN 3.8   No results for input(s): LIPASE, AMYLASE in the last 168 hours. No results for input(s): AMMONIA in the last 168 hours. CBC:  Recent Labs Lab 01/01/17 2110 01/02/17 1221  WBC 13.3* 12.3*  NEUTROABS 10.7*  --   HGB 12.1 11.9*  HCT 39.0 37.0  MCV 86.3 85.5  PLT 232 210   Cardiac Enzymes: No results for input(s): CKTOTAL, CKMB, CKMBINDEX, TROPONINI in the last 168 hours. BNP: BNP (last 3 results) No results for input(s): BNP in the last 8760 hours.  ProBNP (last 3 results) No results for input(s): PROBNP in the last 8760 hours.  CBG: No results for input(s): GLUCAP in the last 168 hours.     SignedEdsel Petrin  [facility] 01/02/2017, 4:38 PM

## 2017-01-02 NOTE — Progress Notes (Signed)
CSW received consult to [facility]. CSW coordinated with Dois DavenportSandra to discharge patient.   [facility] will [facility]: [facility] Anticipated DC date: 01/02/17 Family notified: N/A Transport by: [facility] 5:30pm   Per MD patient ready for DC to [facility]. RN, patient, patient's family, and facility notified of DC. Discharge Summary sent to facility.   CSW signing off.  Cristobal GoldmannNadia Chanda Laperle, [place] Clinical Social Worker [phone]

## 2017-01-02 NOTE — Discharge Instructions (Signed)

## 2017-01-02 NOTE — Progress Notes (Signed)
CRITICAL VALUE ALERT  Critical Value:  Lactic acid 7.0  Date & Time Notied:  01/02/2017 [id number]  Provider Notified: Craige CottaKirby  Orders Received/Actions taken: 1000ml Bolus

## 2017-01-02 NOTE — Progress Notes (Signed)
Inpatient Diabetes Program Recommendations  AACE/ADA: New Consensus Statement on Inpatient Glycemic Control (2015)  Target Ranges:  Prepandial:   less than 140 mg/dL      Peak postprandial:   less than 180 mg/dL (1-2 hours)      Critically ill patients:  140 - 180 mg/dL   Results for [name], [name] (MRN [id number]) as of 01/02/2017 09:48  Ref. Range 01/01/2017 21:10  Glucose Latest Ref Range: 65 - 99 mg/dL 409215 (H)   Review of Glycemic Control  Diabetes history: No Outpatient Diabetes medications: NA Current orders for Inpatient glycemic control: None  Inpatient Diabetes Program Recommendations: Correction (SSI): While inpatient and ordered steroids, please consider ordering CBGs with Novolog correction scale ACHS. A1C: Please consider ordering an A1C to evaluate glycemic control over the past 2-3 months.  Thanks, Orlando PennerMarie Siddiq Kaluzny, RN, MSN, CDE Diabetes Coordinator Inpatient Diabetes Program [phone] (Team Pager from 8am to 5pm)

## 2017-01-02 NOTE — Progress Notes (Signed)
Patient has active tremors. Paged MD.

## 2017-01-07 LAB — CULTURE, BLOOD (ROUTINE X 2)
CULTURE: NO GROWTH
CULTURE: NO GROWTH
SPECIAL REQUESTS: ADEQUATE
SPECIAL REQUESTS: ADEQUATE

## 2017-01-09 ENCOUNTER — Ambulatory Visit
Admission: RE | Admit: 2017-01-09 | Discharge: 2017-01-09 | Disposition: A | Discharge status: Home / Self Care | Payer: No Typology Code available for payment source | Source: Ambulatory Visit | Attending: *Deleted | Admitting: *Deleted

## 2017-01-09 ENCOUNTER — Other Ambulatory Visit: Payer: Self-pay | Admitting: *Deleted

## 2017-01-09 DIAGNOSIS — R7611 Nonspecific reaction to tuberculin skin test without active tuberculosis: Secondary | ICD-10-CM

## 2017-12-23 IMAGING — CR DG CHEST 2V
2 series · 2 of 2 positions shown · non-contrast
Comparison: None.

CLINICAL DATA: Wheezing and coughing.  Recent use of heroin.

EXAM:
CHEST  2 VIEW

[chest lat]
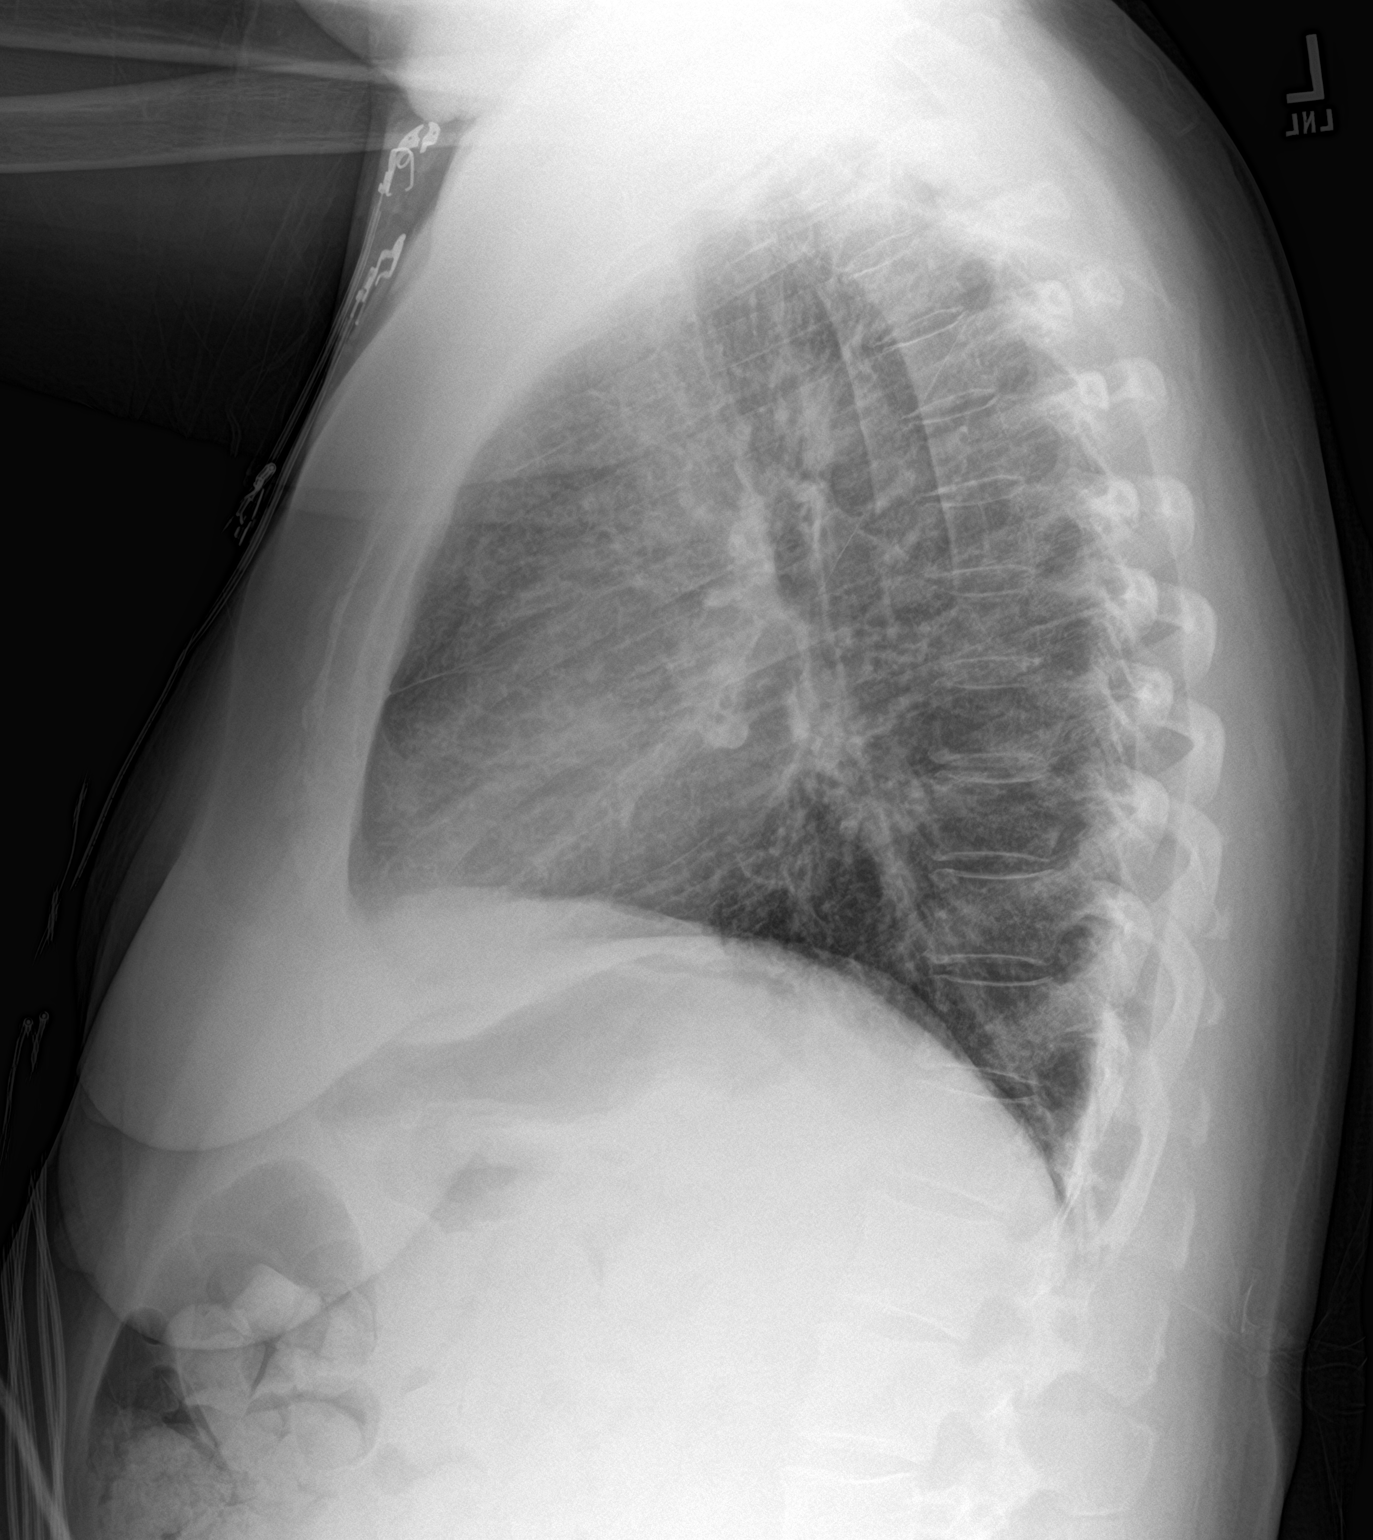

[chest pa]
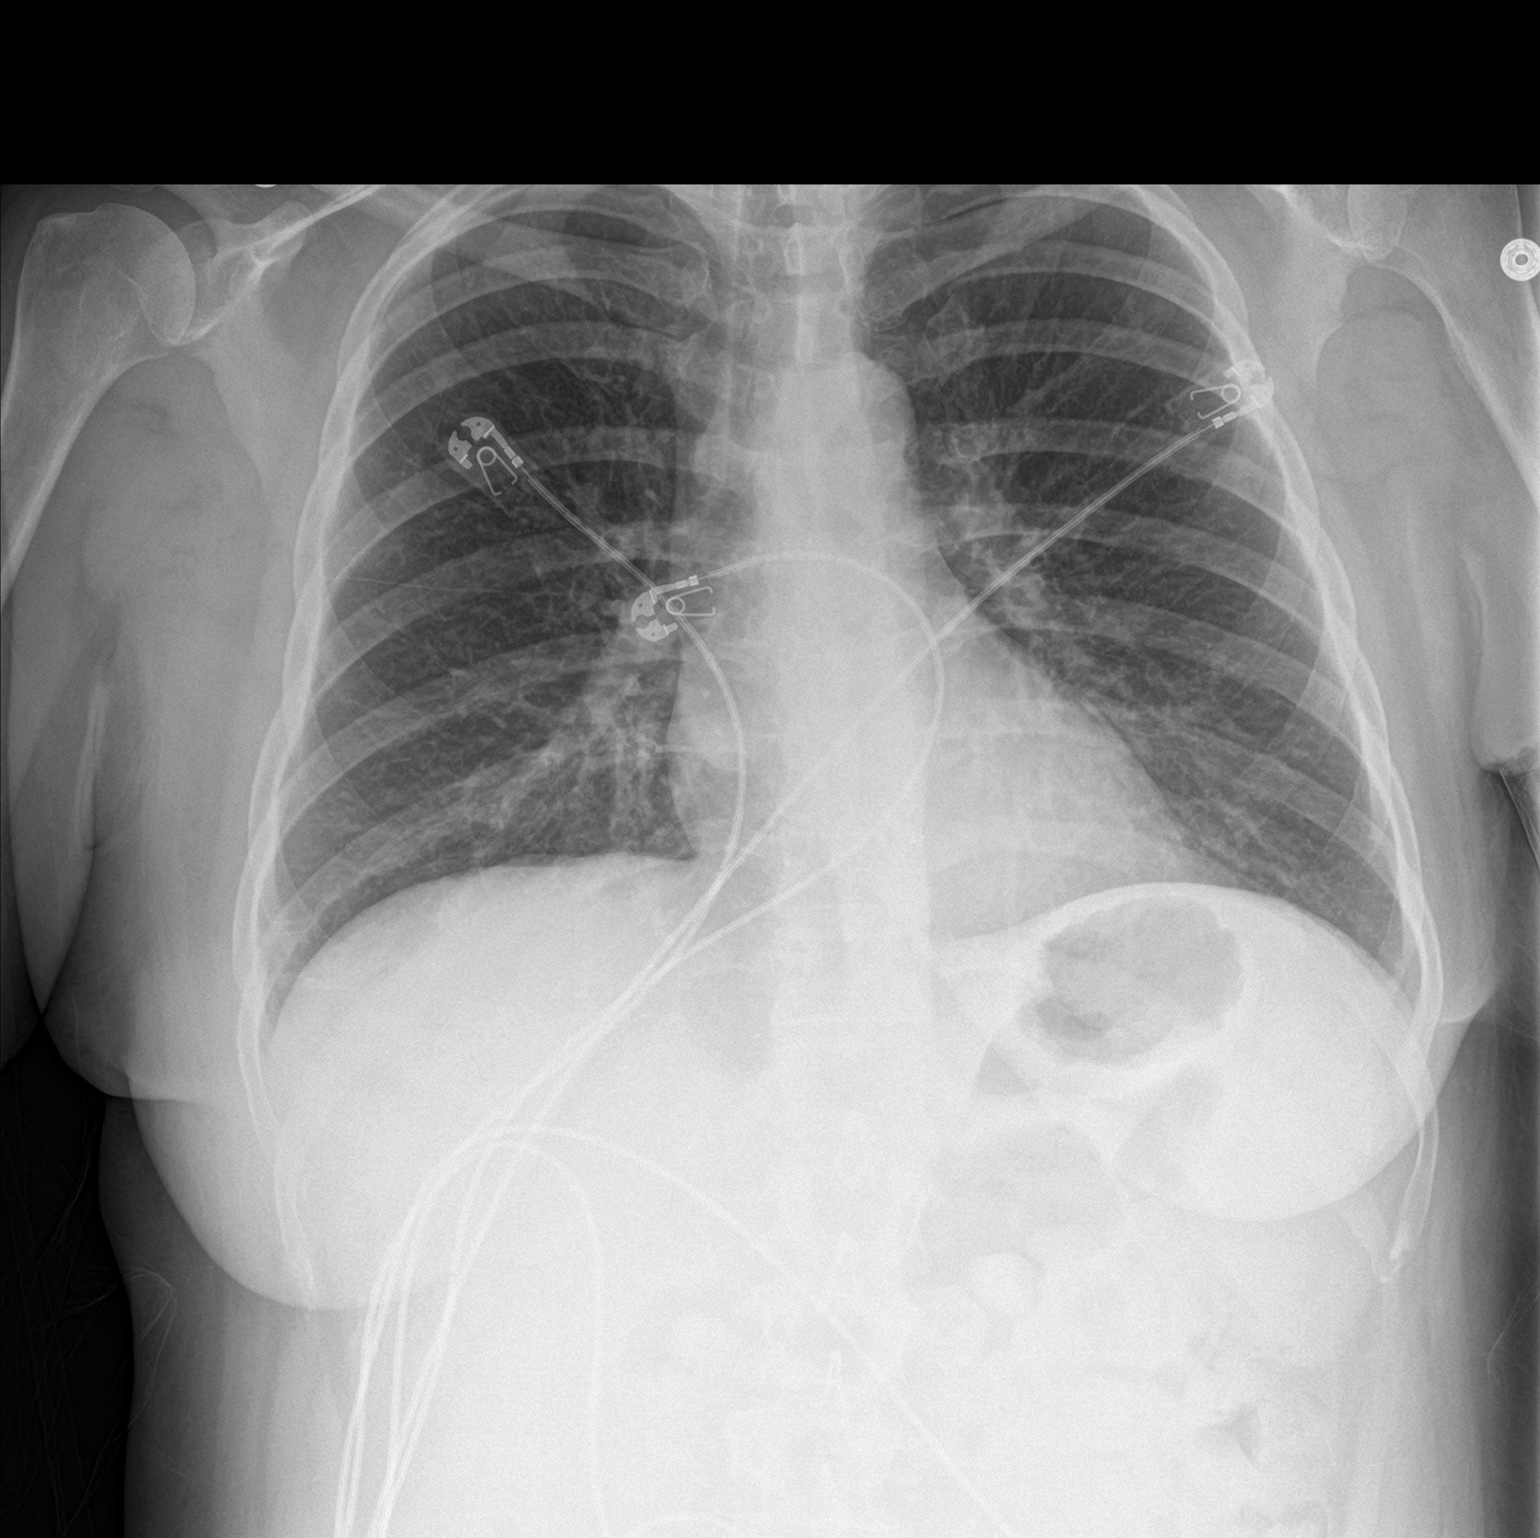

[2 of 2 positions shown; findings below may reference images not displayed]

FINDINGS: The lungs are clear. The pulmonary vasculature is normal. Heart size
is normal. Hilar and mediastinal contours are unremarkable. There is
no pleural effusion.
IMPRESSION: No active cardiopulmonary disease.
